# Patient Record
Sex: Female | Born: 1970 | Race: Black or African American | Hispanic: No | Marital: Married | Smoking: Never smoker
Health system: Southern US, Community
[De-identification: ages and names within clinical notes are randomized; demographics above are authoritative.]

## PROBLEM LIST (undated history)

## (undated) DIAGNOSIS — K5190 Ulcerative colitis, unspecified, without complications: Secondary | ICD-10-CM

## (undated) DIAGNOSIS — N939 Abnormal uterine and vaginal bleeding, unspecified: Secondary | ICD-10-CM

## (undated) DIAGNOSIS — D649 Anemia, unspecified: Secondary | ICD-10-CM

## (undated) HISTORY — DX: Anemia, unspecified: D64.9

## (undated) HISTORY — DX: Abnormal uterine and vaginal bleeding, unspecified: N93.9

## (undated) HISTORY — DX: Ulcerative colitis, unspecified, without complications: K51.90

## (undated) HISTORY — PX: COLONOSCOPY: SHX174

---

## 2004-09-02 DIAGNOSIS — K5190 Ulcerative colitis, unspecified, without complications: Secondary | ICD-10-CM

## 2004-09-02 HISTORY — DX: Ulcerative colitis, unspecified, without complications: K51.90

## 2006-01-10 ENCOUNTER — Emergency Department (HOSPITAL_COMMUNITY): Admission: EM | Admit: 2006-01-10 | Discharge: 2006-01-10 | Payer: Self-pay | Admitting: Family Medicine

## 2012-05-22 ENCOUNTER — Ambulatory Visit (INDEPENDENT_AMBULATORY_CARE_PROVIDER_SITE_OTHER): Payer: BC Managed Care – PPO | Admitting: Family Medicine

## 2012-05-22 VITALS — BP 108/66 | HR 99 | Temp 97.1°F | Resp 16 | Ht 64.0 in | Wt 168.0 lb

## 2012-05-22 DIAGNOSIS — G43009 Migraine without aura, not intractable, without status migrainosus: Secondary | ICD-10-CM

## 2012-05-22 DIAGNOSIS — G43909 Migraine, unspecified, not intractable, without status migrainosus: Secondary | ICD-10-CM

## 2012-05-22 DIAGNOSIS — R51 Headache: Secondary | ICD-10-CM

## 2012-05-22 MED ORDER — KETOROLAC TROMETHAMINE 60 MG/2ML IM SOLN
60.0000 mg | Freq: Once | INTRAMUSCULAR | Status: AC
  Administered 2012-05-22: 60 mg via INTRAMUSCULAR

## 2012-05-22 MED ORDER — SUMATRIPTAN SUCCINATE 25 MG PO TABS: 25.0000 mg | ORAL_TABLET | ORAL | Status: DC | PRN

## 2012-05-22 MED ORDER — PROMETHAZINE HCL 25 MG/ML IJ SOLN
25.0000 mg | Freq: Once | INTRAMUSCULAR | Status: AC
  Administered 2012-05-22: 25 mg via INTRAMUSCULAR

## 2012-05-22 MED ORDER — PROMETHAZINE HCL 25 MG PO TABS: 25.0000 mg | ORAL_TABLET | Freq: Three times a day (TID) | ORAL | Status: DC | PRN

## 2012-05-22 NOTE — Progress Notes (Signed)
Subjective:    Amanda Pratt is a 41 y.o. female who presents for evaluation of headache. Symptoms began about 4 hours  ago. Generally, the headaches are intermittent  and occur once to twice per year.  Most recent flare was earlier last year on contralateral side of head. The headaches are usually aching and unilateral with associated tearing and photophobia and are located in R side.  The patient rates her most severe headaches a 8 on a scale from 1 to 10. The headache has been stable. Work attendance or other daily activities has been affected today by the headaches. Precipitating factors include: stress and sun. The headaches are usually not preceded by an aura. Associated neurologic symptoms: photophobia . The patient denies loss of balance, muscle weakness, numbness of extremities, speech difficulties, vomiting in the early morning and hemiparesis, confusion. Home treatment has included nothing with no improvement. Other history includes: migraine headaches diagnosed in the past.   The following portions of the patient's history were reviewed and updated as appropriate: allergies, current medications, past family history, past medical history, past social history, past surgical history and problem list.  Review of Systems Pertinent items are noted in HPI.    Objective:    BP 108/66  Pulse 99  Temp 97.1 F (36.2 C) (Oral)  Resp 16  Ht 5\' 4"  (1.626 m)  Wt 168 lb (76.204 kg)  BMI 28.84 kg/m2  SpO2 99%  LMP 05/11/2012  General Appearance:    Alert, cooperative, no distress, appears stated age  Head:    Normocephalic, without obvious abnormality, atraumatic  Eyes:    PERRL, conjunctiva/corneas clear, EOM's intact, fundi    benign, both eyes  Ears:    Normal TM's and external ear canals, both ears  Nose:   Nares normal, septum midline, mucosa normal, no drainage    or sinus tenderness  Throat:   Lips, mucosa, and tongue normal; teeth and gums normal  Neck:   Supple, symmetrical,  trachea midline, no adenopathy;    thyroid:  no enlargement/tenderness/nodules; no carotid   bruit or JVD  Back:     Symmetric, no curvature, ROM normal, no CVA tenderness  Lungs:     Clear to auscultation bilaterally, respirations unlabored  Chest Wall:    No tenderness or deformity   Heart:    Regular rate and rhythm, S1 and S2 normal, no murmur, rub   or gallop  Breast Exam:    No tenderness, masses, or nipple abnormality  Abdomen:     Soft, non-tender, bowel sounds active all four quadrants,    no masses, no organomegaly  Genitalia:    Normal female without lesion, discharge or tenderness  Rectal:    Normal tone, normal prostate, no masses or tenderness;   guaiac negative stool  Extremities:   Extremities normal, atraumatic, no cyanosis or edema  Pulses:   2+ and symmetric all extremities  Skin:   Skin color, texture, turgor normal, no rashes or lesions  Lymph nodes:   Cervical, supraclavicular, and axillary nodes normal  Neurologic:   CNII-XII intact, normal strength, sensation and reflexes    throughout      Assessment:    migraine headache, cluster headache variant     Plan:    Lie in darkened room and apply cold packs as needed for pain. Side effect profile discussed in detail. Asked to keep headache diary. toradol 60mg  IM x1, phenergan 25mg  IM x1. phenergan and imitrex at home. Would consider imaging if sxs  persist.

## 2012-12-31 ENCOUNTER — Encounter: Payer: Self-pay | Admitting: Gynecology

## 2012-12-31 ENCOUNTER — Ambulatory Visit (INDEPENDENT_AMBULATORY_CARE_PROVIDER_SITE_OTHER): Payer: BC Managed Care – PPO | Admitting: Gynecology

## 2012-12-31 VITALS — BP 120/60 | Ht 63.0 in | Wt 169.0 lb

## 2012-12-31 DIAGNOSIS — Z124 Encounter for screening for malignant neoplasm of cervix: Secondary | ICD-10-CM

## 2012-12-31 DIAGNOSIS — Z01419 Encounter for gynecological examination (general) (routine) without abnormal findings: Secondary | ICD-10-CM

## 2012-12-31 DIAGNOSIS — N920 Excessive and frequent menstruation with regular cycle: Secondary | ICD-10-CM

## 2012-12-31 DIAGNOSIS — K5190 Ulcerative colitis, unspecified, without complications: Secondary | ICD-10-CM

## 2012-12-31 DIAGNOSIS — D649 Anemia, unspecified: Secondary | ICD-10-CM

## 2012-12-31 LAB — CBC
HCT: 34.9 % — ABNORMAL LOW (ref 36.0–46.0)
Hemoglobin: 11.5 g/dL — ABNORMAL LOW (ref 12.0–15.0)
MCH: 25.8 pg — ABNORMAL LOW (ref 26.0–34.0)
MCV: 78.3 fL (ref 78.0–100.0)
Platelets: 397 10*3/uL (ref 150–400)
RDW: 15.1 % (ref 11.5–15.5)

## 2012-12-31 LAB — TSH: TSH: 1.006 u[IU]/mL (ref 0.350–4.500)

## 2012-12-31 NOTE — Patient Instructions (Addendum)

## 2012-12-31 NOTE — Progress Notes (Signed)
42 y.o.  Married  [place] female   No obstetric history on file. here for annual exam.  Pt  Reports a lot of travelling whichc seems to affect her menses, usually 26d, 5d flow, no  Clots.  2nd day changes q2h maxipad, soaked  no post coital bleed. No fibroids.  Pt reports history of pica   Patient's last menstrual period was 12/31/2012.          Sexually active: yes  The current method of family planning is husband has vasectomy.    Exercising: weights, dance, aerobics 5-6x/wk Last mammogram:  Over 8 years ago Last pap smear: 2012 History of abnormal pap: no Smoking: no Alcohol: yes less than 1x/wk Last colonoscopy: 2013- ulcerative collitis Last Bone Density:  none Last tetanus shot: not sure Last cholesterol check: 2013- Normal  Hgb: PCP     Urine: PCP    Health Maintenance  Topic Date Due  . Pap Smear  07/18/1990  . Tetanus/tdap  07/19/1991  . Influenza Vaccine  05/03/2013    No family history on file.  There are no active problems to display for this patient.   No past medical history on file.  No past surgical history on file.  Allergies: Review of patient's allergies indicates no known allergies.  Current Outpatient Prescriptions  Medication Sig Dispense Refill  . mesalamine (CANASA) 1000 MG suppository Place 1,000 mg rectally at bedtime.      . promethazine (PHENERGAN) 25 MG tablet Take 1 tablet (25 mg total) by mouth every 8 (eight) hours as needed for nausea.  20 tablet  0  . SUMAtriptan (IMITREX) 25 MG tablet Take 1 tablet (25 mg total) by mouth every 2 (two) hours as needed for migraine.  8 tablet  0   No current facility-administered medications for this visit.    ROS: Pertinent items are noted in HPI.  Social Hx:    Exam:    BP 120/60  Ht 5\' 3"  (1.6 m)  Wt 169 lb (76.658 kg)  BMI 29.94 kg/m2  LMP 12/31/2012   Wt Readings from Last 3 Encounters:  12/31/12 169 lb (76.658 kg)  05/22/12 168 lb (76.204 kg)     Ht Readings from Last 3  Encounters:  12/31/12 5\' 3"  (1.6 m)  05/22/12 5\' 4"  (1.626 m)    General appearance: alert, cooperative and appears stated age Head: Normocephalic, without obvious abnormality, atraumatic Neck: no adenopathy, supple, symmetrical, trachea midline and thyroid not enlarged, symmetric, no tenderness/mass/nodules Lungs: clear to auscultation bilaterally Breasts: Inspection negative, No nipple retraction or dimpling, No nipple discharge or bleeding, No axillary or supraclavicular adenopathy, Normal to palpation without dominant masses Heart: regular rate and rhythm Abdomen: soft, non-tender; bowel sounds normal; no masses,  no organomegaly Extremities: extremities normal, atraumatic, no cyanosis or edema Skin: Skin color, texture, turgor normal. No rashes or lesions Lymph nodes: Cervical, supraclavicular, and axillary nodes normal. No abnormal inguinal nodes palpated Neurologic: Grossly normal   Pelvic: External genitalia:  no lesions              Urethra:  normal appearing urethra with no masses, tenderness or lesions              Bartholins and Skenes: normal                 Vagina: normal appearing vagina with normal color and discharge, no lesions              Cervix: normal appearance  Pap taken: yes        Bimanual Exam:  Uterus:  uterus is normal size, shape, consistency and nontender                                      Adnexa: normal adnexa in size, nontender and no masses                                      Rectovaginal: DEFERRED DUE TO UC                                        Anus:  deferred  A: normal gyn exam Menorrhagia UC      P: mammogram pap smear with HR HPV CBC, TSH,triage u/s based on results, will keep a menstrual calendar return annually or prn     An After Visit Summary was printed and given to the patient.

## 2013-01-01 NOTE — Addendum Note (Signed)
Addended by: Douglass Rivers on: 01/01/2013 10:21 AM   Modules accepted: Orders

## 2013-01-05 ENCOUNTER — Encounter: Payer: Self-pay | Admitting: Gynecology

## 2013-01-07 ENCOUNTER — Telehealth: Payer: Self-pay | Admitting: Gynecology

## 2013-01-07 NOTE — Telephone Encounter (Signed)
[phone] Patient returning Tiffany's call.

## 2013-01-07 NOTE — Telephone Encounter (Signed)
Notified pt of labs see result note.

## 2013-01-07 NOTE — Telephone Encounter (Signed)
Returning a call to Jasmine. °

## 2013-01-15 ENCOUNTER — Telehealth: Payer: Self-pay | Admitting: Nurse Practitioner

## 2013-01-15 NOTE — Telephone Encounter (Signed)
Patient wants to know if she can control menstrual cycle.

## 2013-02-08 ENCOUNTER — Ambulatory Visit (INDEPENDENT_AMBULATORY_CARE_PROVIDER_SITE_OTHER): Payer: BC Managed Care – PPO

## 2013-02-08 VITALS — BP 118/60 | Wt 173.0 lb

## 2013-02-08 DIAGNOSIS — Z23 Encounter for immunization: Secondary | ICD-10-CM

## 2013-02-08 NOTE — Progress Notes (Signed)
Patient tolerated injection well.

## 2013-04-20 ENCOUNTER — Other Ambulatory Visit: Payer: Self-pay

## 2013-04-20 DIAGNOSIS — Z1231 Encounter for screening mammogram for malignant neoplasm of breast: Secondary | ICD-10-CM

## 2013-04-26 ENCOUNTER — Ambulatory Visit: Payer: BC Managed Care – PPO

## 2013-04-26 ENCOUNTER — Ambulatory Visit (INDEPENDENT_AMBULATORY_CARE_PROVIDER_SITE_OTHER): Payer: BC Managed Care – PPO | Admitting: Family Medicine

## 2013-04-26 VITALS — BP 116/70 | HR 83 | Temp 98.0°F | Resp 16 | Ht 64.0 in | Wt 172.0 lb

## 2013-04-26 DIAGNOSIS — M79609 Pain in unspecified limb: Secondary | ICD-10-CM

## 2013-04-26 DIAGNOSIS — M722 Plantar fascial fibromatosis: Secondary | ICD-10-CM

## 2013-04-26 DIAGNOSIS — M79672 Pain in left foot: Secondary | ICD-10-CM

## 2013-04-26 NOTE — Progress Notes (Signed)
[facility] [address], [place] [address] [phone]  Date:  04/26/2013   Name:  Amanda Pratt   DOB:  September 07, 1970   MRN:  [id number]  PCP:  No PCP Per Patient    Chief Complaint: Left heel pain with pressure   History of Present Illness:  Amanda Pratt is a 42 y.o. very pleasant female patient who presents with the following:  She is here today with left heel pain.  She was at a trampoline park about 1 month ago.  She did land on the padded spacers between the trampolines a couple of times. The next day she noted some pain in her heel area.  She was not able to bear weight for a few hours.  After a few days it felt bette,r but now after long periods of sitting or first thing in the am the heel is very sore.  It feels much better after exercise.  The right heel is ok Never had this in the past.   She has not tried anything else so far.   She is generally healthy except for UC   Patient Active Problem List   Diagnosis Date Noted  . Ulcerative colitis, unspecified 12/31/2012    Past Medical History  Diagnosis Date  . Abnormal uterine bleeding   . Anemia   . Ulcerative colitis 2006    rectal     Past Surgical History  Procedure Laterality Date  . Cesarean section  2001  . Cesarean section  2003  . Colonoscopy  2006, 2008, 2012    2012 sigmoid only    History  Substance Use Topics  . Smoking status: Never Smoker   . Smokeless tobacco: Not on file  . Alcohol Use: Yes     Comment: socially    Family History  Problem Relation Age of Onset  . Hypertension Father   . Thyroid disease Sister     Allergies  Allergen Reactions  . Other     anesthesia    Medication list has been reviewed and updated.  Current Outpatient Prescriptions on File Prior to Visit  Medication Sig Dispense Refill  . mesalamine (CANASA) 1000 MG suppository Place 1,000 mg rectally at bedtime.       No current facility-administered medications on file prior  to visit.    Review of Systems:  As per HPI- otherwise negative.   Physical Examination: Filed Vitals:   04/26/13 0953  BP: 116/70  Pulse: 83  Temp: 98 F (36.7 C)  Resp: 16   Filed Vitals:   04/26/13 0953  Height: 5\' 4"  (1.626 m)  Weight: 172 lb (78.019 kg)   Body mass index is 29.51 kg/(m^2). Ideal Body Weight: Weight in (lb) to have BMI = 25: 145.3  GEN: WDWN, NAD, Non-toxic, A & O x 3 HEENT: Atraumatic, Normocephalic. Neck supple. No masses, No LAD. Ears and Nose: No external deformity. CV: RRR, No M/G/R. No JVD. No thrill. No extra heart sounds. PULM: CTA B, no wheezes, crackles, rhonchi. No retractions. No resp. distress. No accessory muscle use. EXTR: No c/c/e NEURO Normal gait.  PSYCH: Normally interactive. Conversant. Not depressed or anxious appearing.  Calm demeanor.  Pes planus bilaterally.  Right foot is normal, non- tender Left foot is tender at the plantar fascia insertion.  No swelling, redness or heat.  Good pulse, normal cap refill and perfusion.    [facility] reading (PRIMARY) by  Dr. Patsy Lager. Left foot:  negative Left heel:  negative  LEFT FOOT - COMPLETE 3+ VIEW  Comparison: None.  Findings: Three views of the left foot submitted. No acute fracture or subluxation. No periosteal reaction or bony erosion.  IMPRESSION: No acute fracture or subluxation.  Clinically significant discrepancy from primary report, if provided: None  LEFT OS CALCIS - 2+ VIEW  Comparison: left foot same day  Findings: Single view of the left calcaneus submitted. No acute fracture or subluxation. No radiopaque foreign body.  IMPRESSION: No acute fracture or subluxation.  Clinically significant discrepancy from primary report, if provided: None   Assessment and Plan: Heel pain, left - Plan: DG Foot Complete Left, DG Os Calcis Left  Plantar fascia syndrome  Plantar fascitis for one month following jumping at the trampoline park.  Discussed with pt.  She would  prefer conservative tx for now.  See patient instructions for more details.     Signed Abbe Amsterdam, MD

## 2013-04-26 NOTE — Patient Instructions (Addendum)
It appears that you have plantar fascitis.  Try stretches, ice massage, can roll, heel cups.  Let me know if you are not better in the next few weeks, or if you would like to have a steroid injection.    Plantar Fasciitis Plantar fasciitis is a common condition that causes foot pain. It is soreness (inflammation) of the band of tough fibrous tissue on the bottom of the foot that runs from the heel bone (calcaneus) to the ball of the foot. The cause of this soreness may be from excessive standing, poor fitting shoes, running on hard surfaces, being overweight, having an abnormal walk, or overuse (this is common in runners) of the painful foot or feet. It is also common in aerobic exercise dancers and ballet dancers. SYMPTOMS  Most people with plantar fasciitis complain of:  Severe pain in the morning on the bottom of their foot especially when taking the first steps out of bed. This pain recedes after a few minutes of walking.  Severe pain is experienced also during walking following a long period of inactivity.  Pain is worse when walking barefoot or up stairs DIAGNOSIS   Your caregiver will diagnose this condition by examining and feeling your foot.  Special tests such as X-rays of your foot, are usually not needed. PREVENTION   Consult a sports medicine professional before beginning a new exercise program.  Walking programs offer a good workout. With walking there is a lower [place] of overuse injuries common to runners. There is less impact and less jarring of the joints.  Begin all new exercise programs slowly. If problems or pain develop, decrease the amount of time or distance until you are at a comfortable level.  Wear good shoes and replace them regularly.  Stretch your foot and the heel cords at the back of the ankle (Achilles tendon) both before and after exercise.  Run or exercise on even surfaces that are not hard. For example, asphalt is better than pavement.  Do not run  barefoot on hard surfaces.  If using a treadmill, vary the incline.  Do not continue to workout if you have foot or joint problems. Seek professional help if they do not improve. HOME CARE INSTRUCTIONS   Avoid activities that cause you pain until you recover.  Use ice or cold packs on the problem or painful areas after working out.  Only take over-the-counter or prescription medicines for pain, discomfort, or fever as directed by your caregiver.  Soft shoe inserts or athletic shoes with air or gel sole cushions may be helpful.  If problems continue or become more severe, consult a sports medicine caregiver or your own health care provider. Cortisone is a potent anti-inflammatory medication that may be injected into the painful area. You can discuss this treatment with your caregiver. MAKE SURE YOU:   Understand these instructions.  Will watch your condition.  Will get help right away if you are not doing well or get worse. Document Released: 05/14/2001 Document Revised: 11/11/2011 Document Reviewed: 07/13/2008 [facility] Patient Information 2014 [place], [place].

## 2013-05-10 ENCOUNTER — Ambulatory Visit: Payer: Self-pay

## 2013-05-27 ENCOUNTER — Ambulatory Visit
Admission: RE | Admit: 2013-05-27 | Discharge: 2013-05-27 | Disposition: A | Discharge status: Home / Self Care | Payer: BC Managed Care – PPO | Source: Ambulatory Visit

## 2013-05-27 DIAGNOSIS — Z1231 Encounter for screening mammogram for malignant neoplasm of breast: Secondary | ICD-10-CM

## 2013-07-08 ENCOUNTER — Other Ambulatory Visit: Payer: Self-pay

## 2013-12-29 ENCOUNTER — Other Ambulatory Visit: Payer: Self-pay | Admitting: Occupational Medicine

## 2013-12-29 ENCOUNTER — Ambulatory Visit: Payer: Self-pay

## 2013-12-29 DIAGNOSIS — Z0000 Encounter for general adult medical examination without abnormal findings: Secondary | ICD-10-CM

## 2014-01-07 ENCOUNTER — Telehealth: Payer: Self-pay | Admitting: Gynecology

## 2014-01-07 MED ORDER — NORETHINDRONE ACET-ETHINYL EST 1-20 MG-MCG PO TABS: 1.0000 | ORAL_TABLET | Freq: Every day | ORAL | Status: AC

## 2014-01-07 NOTE — Telephone Encounter (Signed)
Patient requests to speak with nurse. She has questions about her menstrual cycle.

## 2014-01-07 NOTE — Telephone Encounter (Signed)
Spoke with patient. She states that she has been having irregular cycles and has seen Dr. Farrel GobbleLathrop for same. She states that sometimes between cycles is 22 days, 18 days, or up to 25 days with sometimes longer menses in between. She is wondering if she can get on a short term birth control to control cycle as she is going away for her anniversary with her husband and does not want to worry about her cycle. Current birth control is vasectomy. I advised that it may be too late to have cycle regulation by 5/25 but that I would send a message to covering doctor for Dr. Farrel GobbleLathrop and would return her call. Patient is agreeable.   Routing to Dr. Hyacinth MeekerMiller as covering and   cc Dr. Farrel GobbleLathrop

## 2014-01-07 NOTE — Telephone Encounter (Signed)
Spoke with patient. Advised of message from Dr. Hyacinth MeekerMiller. Patient agreeable. Will call back with any further needs and is thankful for quick return call.   Routing to provider for final review. Patient agreeable to disposition. Will close encounter

## 2014-01-07 NOTE — Telephone Encounter (Signed)
OK to start Loestrin 1/20.  Can start now.  She may still have irregular bleeding but hopefully will stop the bleeding before her anniversary.  Rx for 1 month with 1 RF.

## 2016-03-26 IMAGING — CR DG CHEST 2V
2 series · 2 of 2 positions shown · non-contrast
Comparison: None.

CLINICAL DATA: Physical exam, nonsmoker

EXAM:
CHEST  2 VIEW

[view not recorded (1 of 2)]
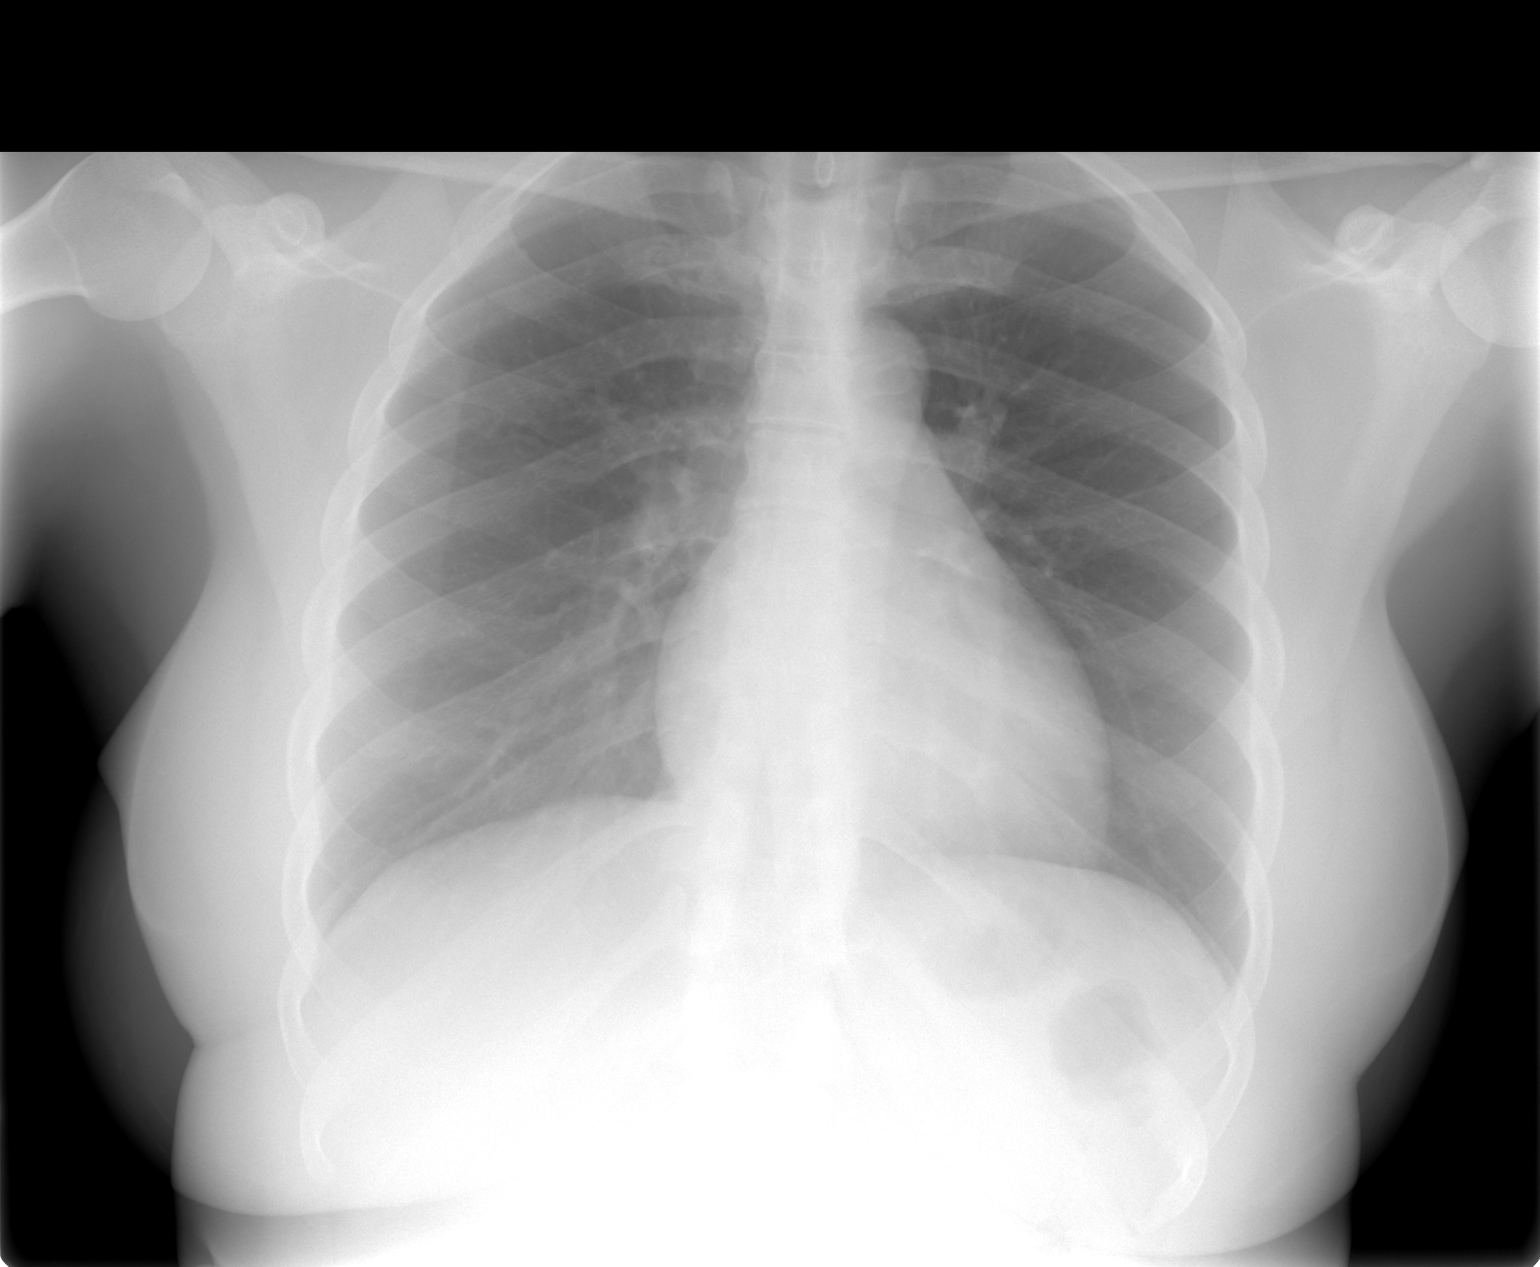

[view not recorded (2 of 2)]
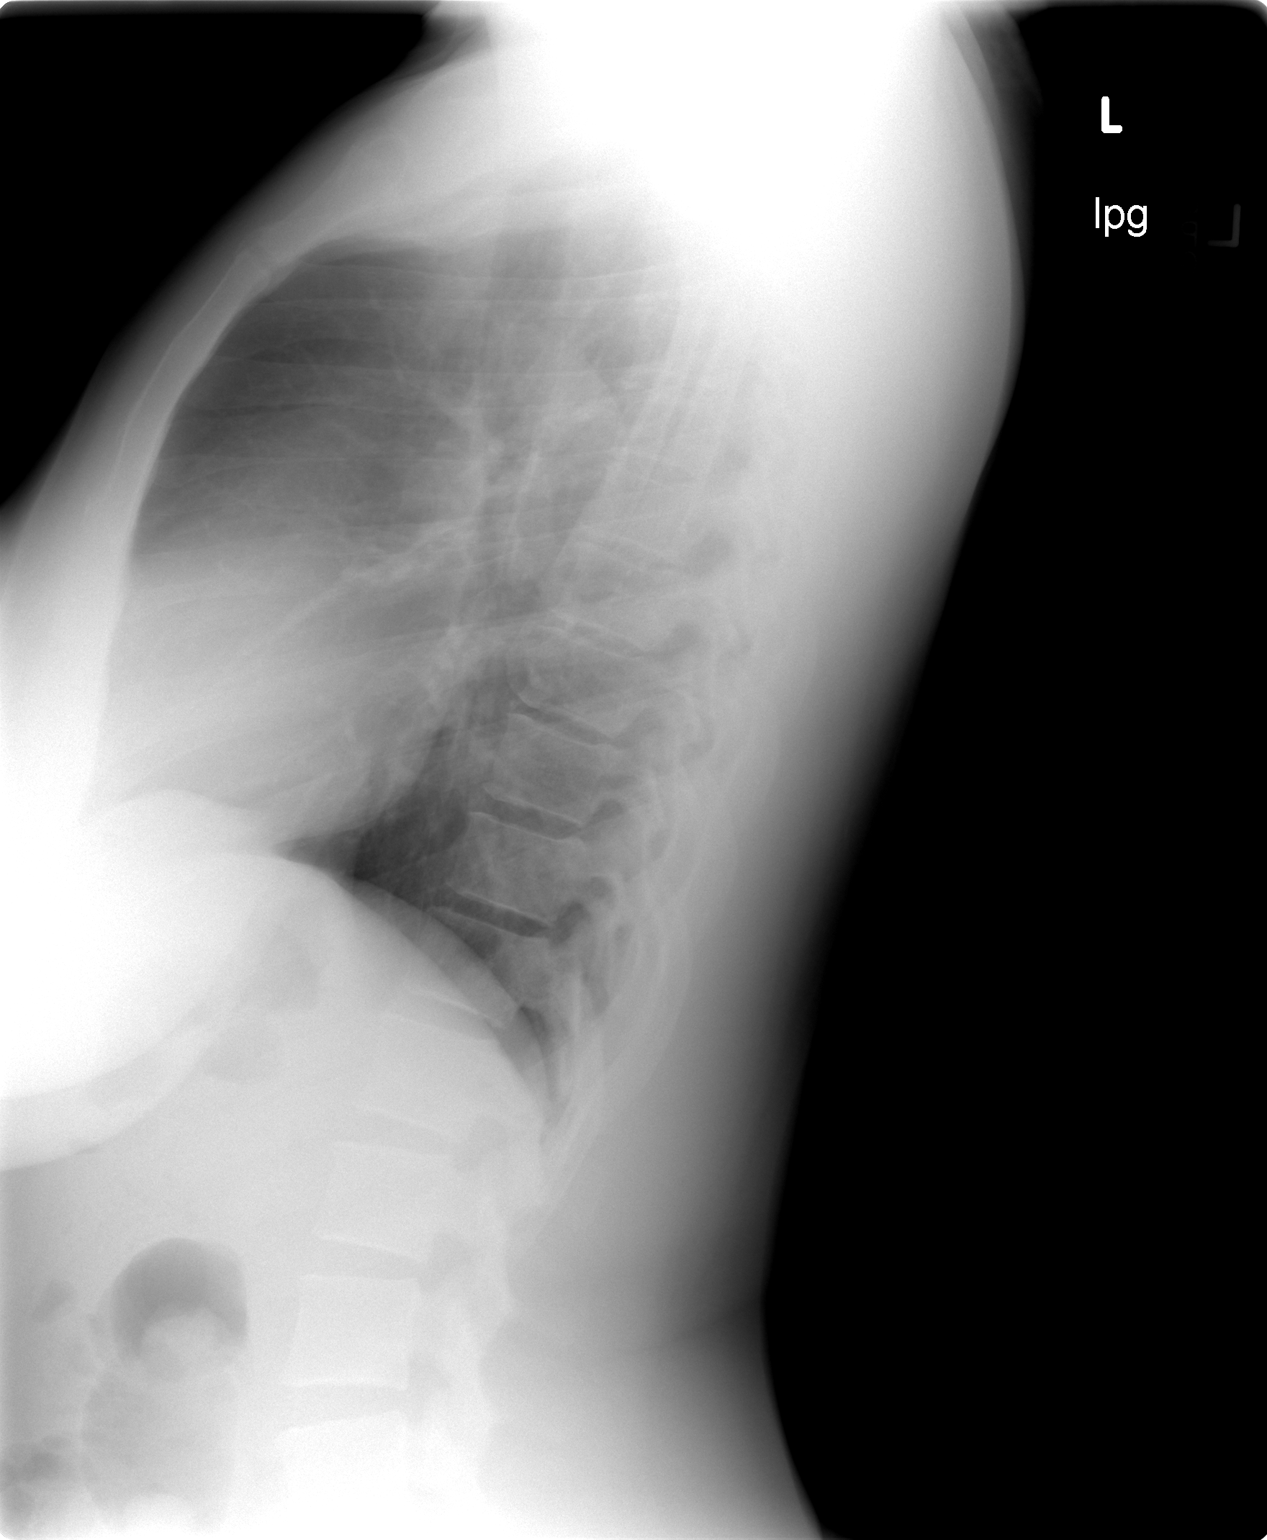

[2 of 2 positions shown; findings below may reference images not displayed]

FINDINGS: The heart size and mediastinal contours are within normal limits.
Both lungs are clear. The visualized skeletal structures are
unremarkable.
IMPRESSION: No active cardiopulmonary disease.
